# Patient Record
Sex: Male | Born: 1950
Health system: Southern US, Community
[De-identification: ages and names within clinical notes are randomized; demographics above are authoritative.]

---

## 2004-07-05 ENCOUNTER — Ambulatory Visit: Payer: Self-pay | Admitting: Physician Assistant

## 2004-11-07 ENCOUNTER — Encounter: Payer: Self-pay | Admitting: Unknown Physician Specialty

## 2005-08-23 ENCOUNTER — Emergency Department: Payer: Self-pay | Admitting: Unknown Physician Specialty

## 2005-08-23 ENCOUNTER — Other Ambulatory Visit: Payer: Self-pay

## 2006-11-08 ENCOUNTER — Observation Stay: Payer: Self-pay | Admitting: Internal Medicine

## 2006-11-08 ENCOUNTER — Other Ambulatory Visit: Payer: Self-pay

## 2006-12-27 ENCOUNTER — Ambulatory Visit: Payer: Self-pay | Admitting: Internal Medicine

## 2008-01-20 IMAGING — CT CT ABD-PELV W/ CM
1 of 2 series · 15 of 32 positions shown, 19 images · non-contrast
Comparison: none

REASON FOR EXAM: abd pain
COMMENTS:

[Series 2: abdomen · axial · 0.72mm/px · z∈[+192,+598]mm · 15 of 89 slices shown, 19 images]
[im 4/89  soft-tissue]
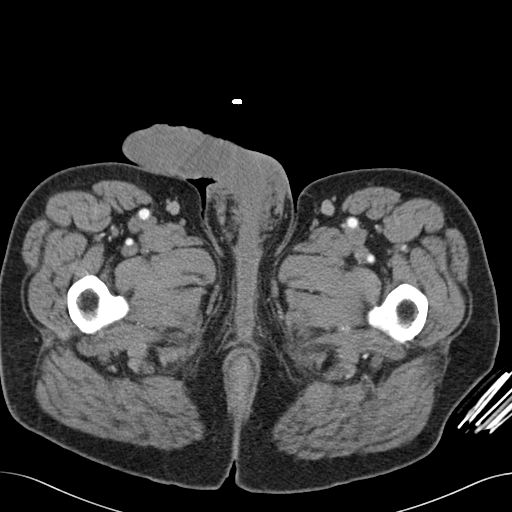
[im 4/89  bone]
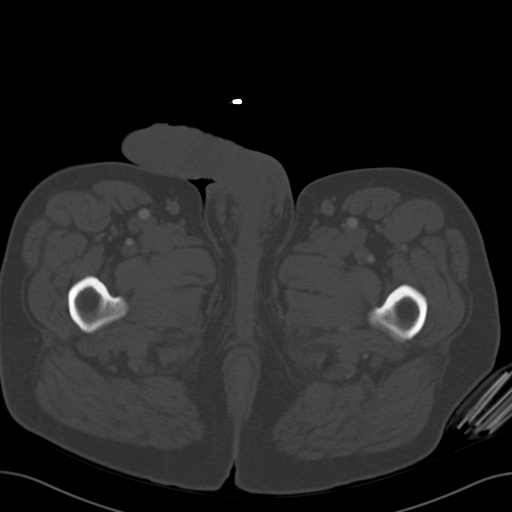
[im 12/89  soft-tissue]
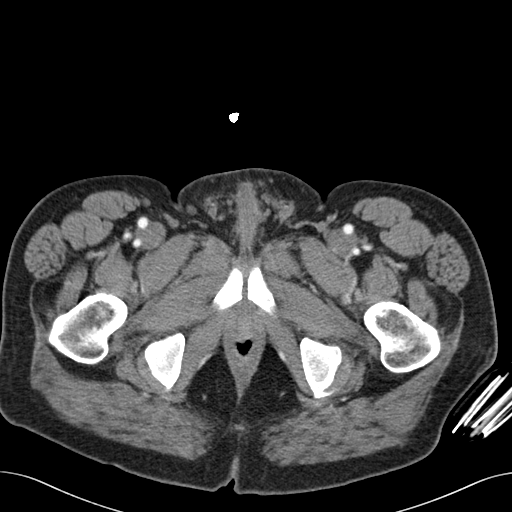
[im 19/89  soft-tissue]
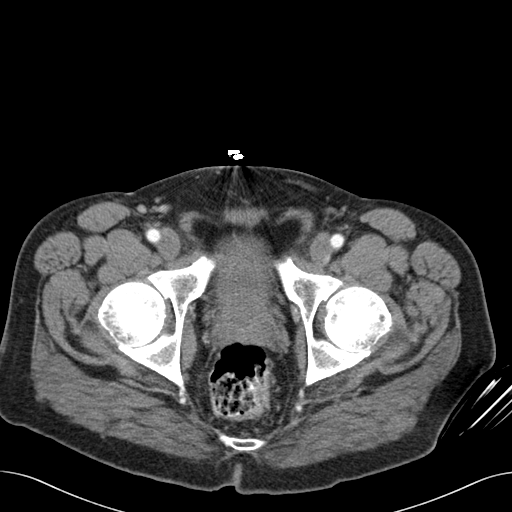
[im 26/89  soft-tissue]
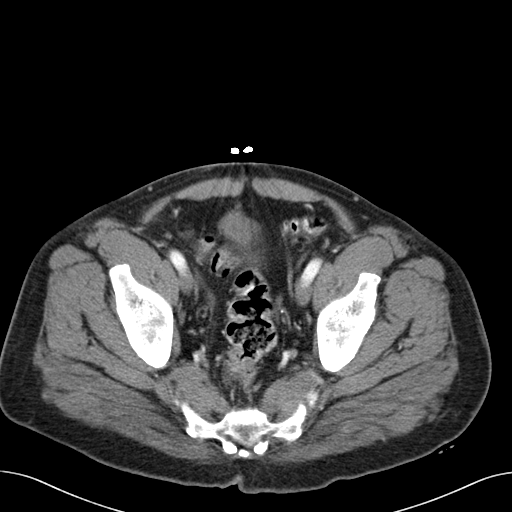
[im 30/89  soft-tissue]
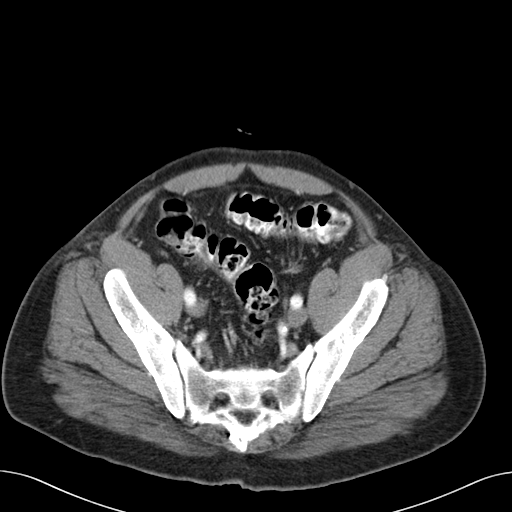
[im 37/89  soft-tissue]
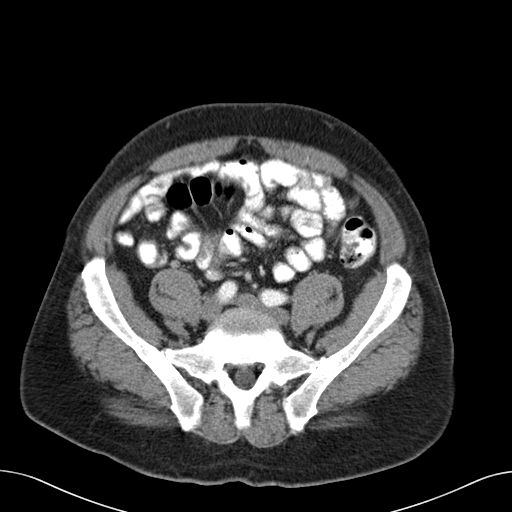
[im 45/89  soft-tissue]
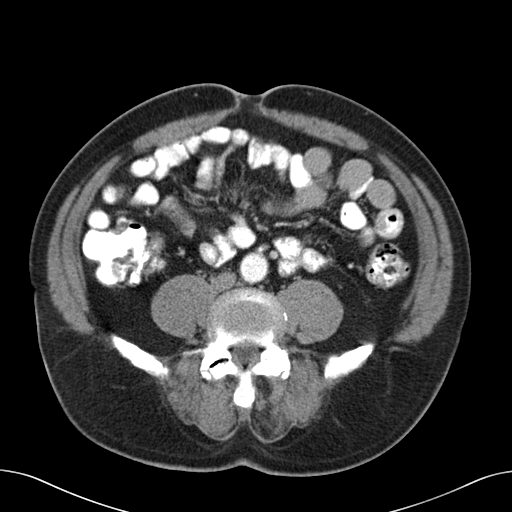
[im 52/89  soft-tissue]
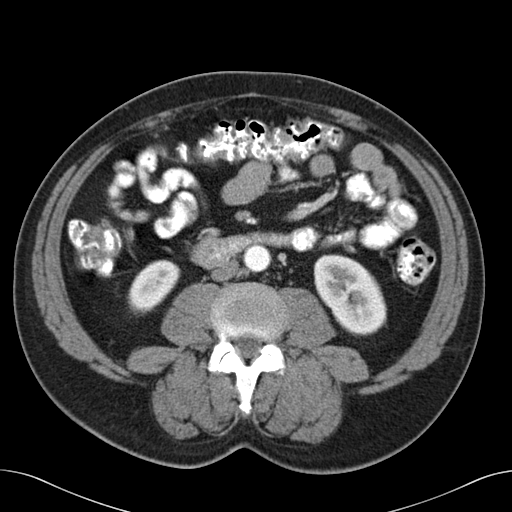
[im 59/89  soft-tissue]
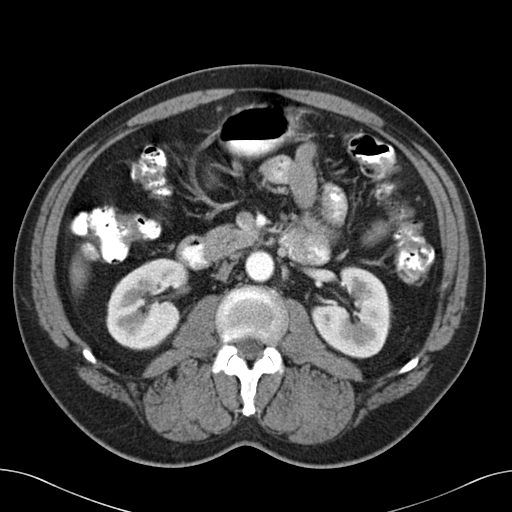
[im 59/89  bone]
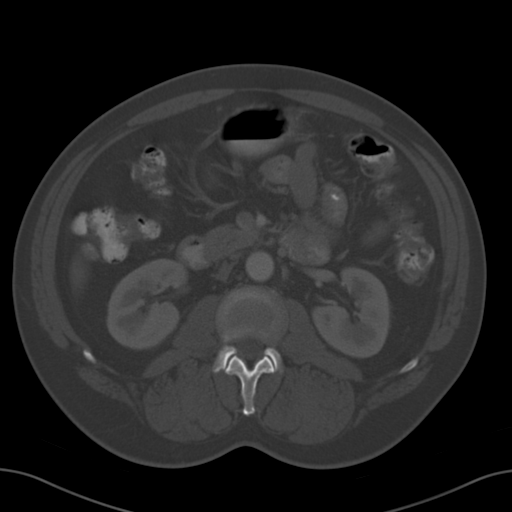
[im 63/89  soft-tissue]
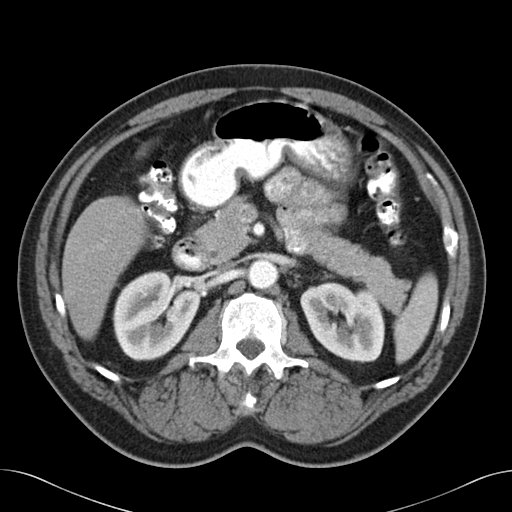
[im 70/89  soft-tissue]
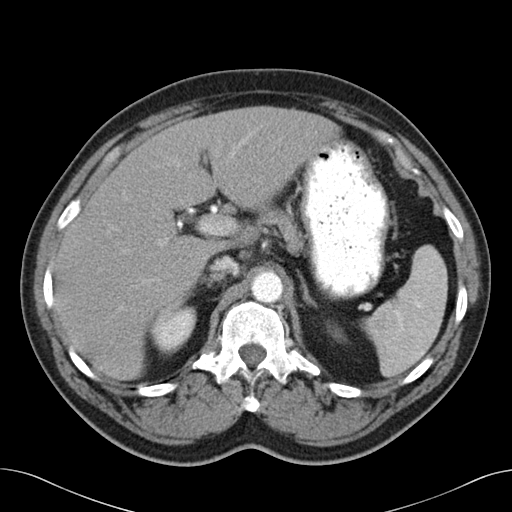
[im 74/89  lung]
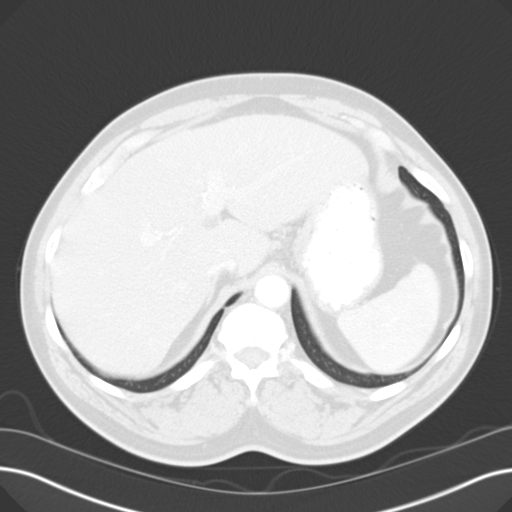
[im 78/89  soft-tissue]
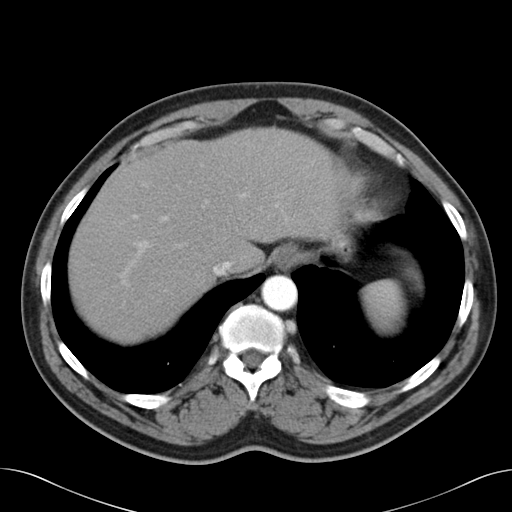
[im 78/89  lung]
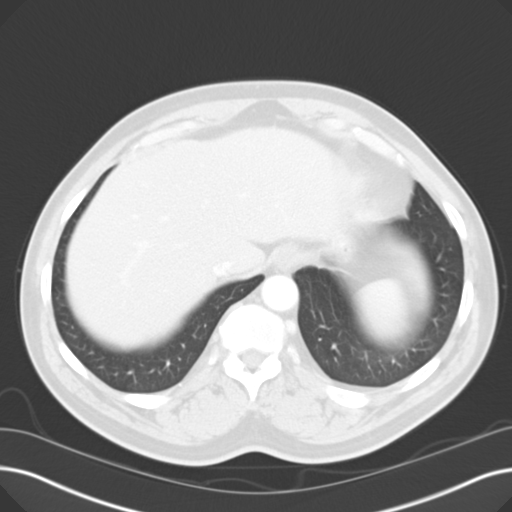
[im 81/89  lung]
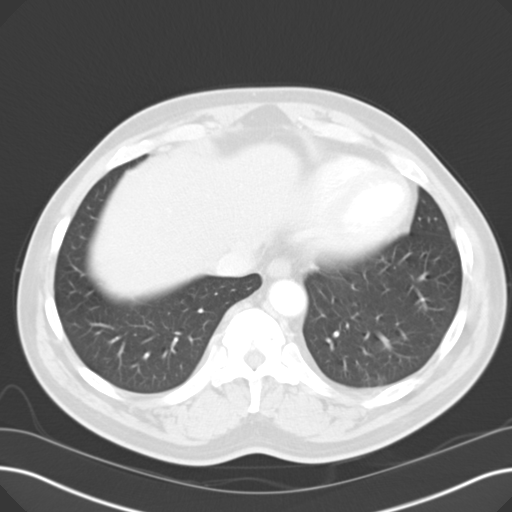
[im 85/89  soft-tissue]
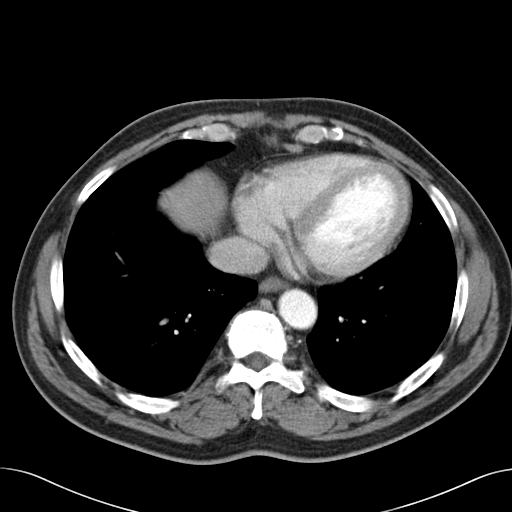
[im 85/89  lung]
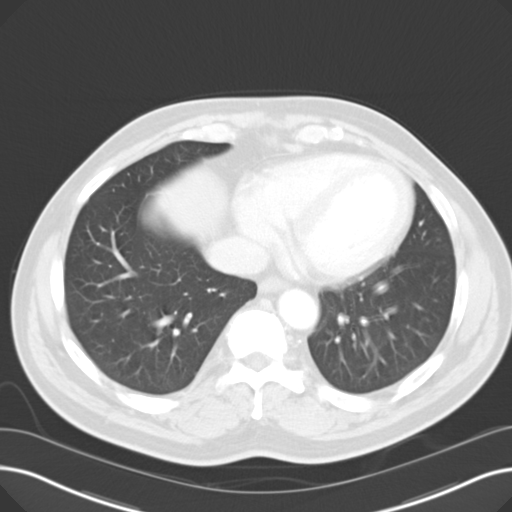

[15 of 32 positions shown; findings below may reference images not displayed]

PROCEDURE:     CT  - CT ABDOMEN / PELVIS  W  - December 27, 2006  [DATE]

RESULT:     IV contrast enhanced CT of the Abdomen and Pelvis was obtained.
The liver and spleen are normal.  The pancreas is normal. Adrenals are
normal.  No focal renal abnormality is identified.  The appendix is normal.
Surgical clips are noted in the gallbladder fossa. No bowel distention is
noted. No inguinal adenopathy is noted.  The lung bases are clear. No free
air is identified. A tiny umbilical hernia is present with just herniation
of fat. Small inguinal lymph nodes are noted. These are nonspecific.
IMPRESSION: 1.     Tiny umbilical hernia with just herniation of fat.
2.     The patient has had a prior cholecystectomy. There is no biliary
distention.  No other abnormality is identified.  The RIGHT lower quadrant
is unremarkable.  Retroperitoneum including abdominal aorta is unremarkable.
 The LEFT lower quadrant is unremarkable.

## 2009-03-12 ENCOUNTER — Other Ambulatory Visit: Payer: Self-pay | Admitting: Internal Medicine

## 2009-03-25 ENCOUNTER — Ambulatory Visit: Payer: Self-pay | Admitting: Internal Medicine

## 2009-04-06 ENCOUNTER — Ambulatory Visit: Payer: Self-pay | Admitting: Oncology

## 2009-04-15 LAB — CMP (CANCER CENTER ONLY)
ALT(SGPT): 65 U/L — ABNORMAL HIGH (ref 10–47)
AST: 44 U/L — ABNORMAL HIGH (ref 11–38)
Albumin: 3.7 g/dL (ref 3.3–5.5)
CO2: 29 mEq/L (ref 18–33)
Calcium: 9.4 mg/dL (ref 8.0–10.3)
Chloride: 102 mEq/L (ref 98–108)
Potassium: 4.4 mEq/L (ref 3.3–4.7)
Sodium: 137 mEq/L (ref 128–145)
Total Protein: 7.5 g/dL (ref 6.4–8.1)

## 2009-04-15 LAB — CBC WITH DIFFERENTIAL (CANCER CENTER ONLY)
BASO%: 0.5 % (ref 0.0–2.0)
EOS%: 5.9 % (ref 0.0–7.0)
HCT: 39.2 % (ref 38.7–49.9)
LYMPH#: 1.4 10*3/uL (ref 0.9–3.3)
MCHC: 33.4 g/dL (ref 32.0–35.9)
MONO#: 0.3 10*3/uL (ref 0.1–0.9)
NEUT#: 1.2 10*3/uL — ABNORMAL LOW (ref 1.5–6.5)
NEUT%: 39.2 % — ABNORMAL LOW (ref 40.0–80.0)
Platelets: 193 10*3/uL (ref 145–400)
RDW: 12.8 % (ref 10.5–14.6)
WBC: 3.1 10*3/uL — ABNORMAL LOW (ref 4.0–10.0)

## 2009-04-19 LAB — IRON AND TIBC: UIBC: 181 ug/dL

## 2009-04-19 LAB — PROTEIN ELECTROPHORESIS, SERUM
Albumin ELP: 53.7 % — ABNORMAL LOW (ref 55.8–66.1)
Alpha-1-Globulin: 4.2 % (ref 2.9–4.9)
Beta 2: 4.7 % (ref 3.2–6.5)
Beta Globulin: 5.9 % (ref 4.7–7.2)
Total Protein, Serum Electrophoresis: 7.3 g/dL (ref 6.0–8.3)

## 2009-04-19 LAB — LACTATE DEHYDROGENASE: LDH: 139 U/L (ref 94–250)

## 2009-06-11 ENCOUNTER — Ambulatory Visit: Payer: Self-pay | Admitting: Oncology

## 2009-06-17 LAB — CBC WITH DIFFERENTIAL/PLATELET
BASO%: 0.9 % (ref 0.0–2.0)
EOS%: 4.9 % (ref 0.0–7.0)
LYMPH%: 42.1 % (ref 14.0–49.0)
MCH: 29.3 pg (ref 27.2–33.4)
MCHC: 33 g/dL (ref 32.0–36.0)
MONO#: 0.6 10*3/uL (ref 0.1–0.9)
Platelets: 174 10*3/uL (ref 140–400)
RBC: 4.4 10*6/uL (ref 4.20–5.82)
WBC: 3.3 10*3/uL — ABNORMAL LOW (ref 4.0–10.3)

## 2010-04-18 IMAGING — US ABDOMEN ULTRASOUND
1 series · 17 of 25 positions shown · non-contrast
Comparison: none

REASON FOR EXAM: Abn LFT
COMMENTS:

[Series 1: abdomen ultrasound · 17 of 52 slices shown]
[im 1/52]
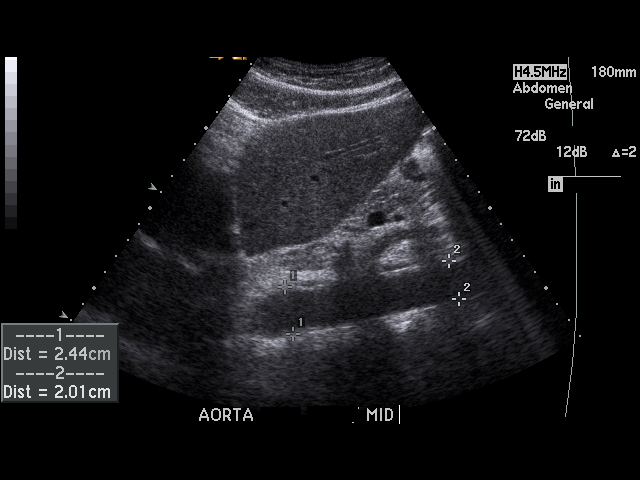
[im 5/52]
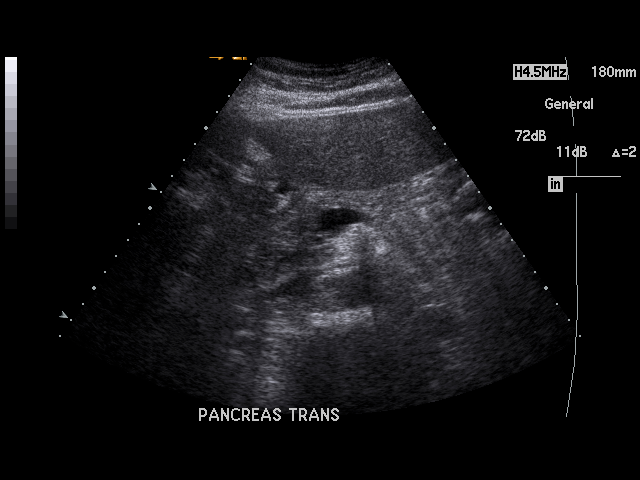
[im 7/52]
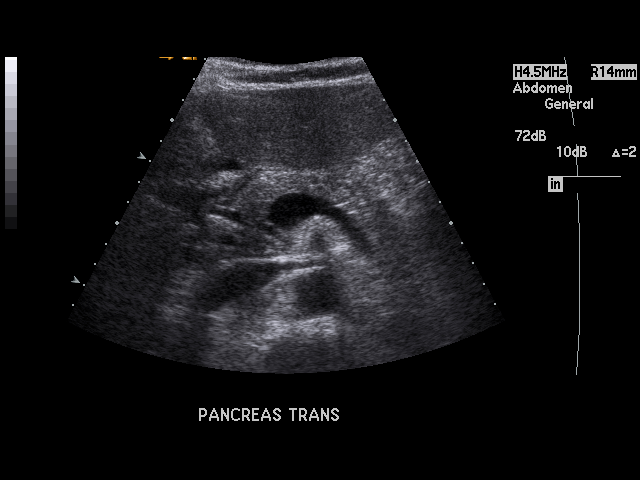
[im 11/52]
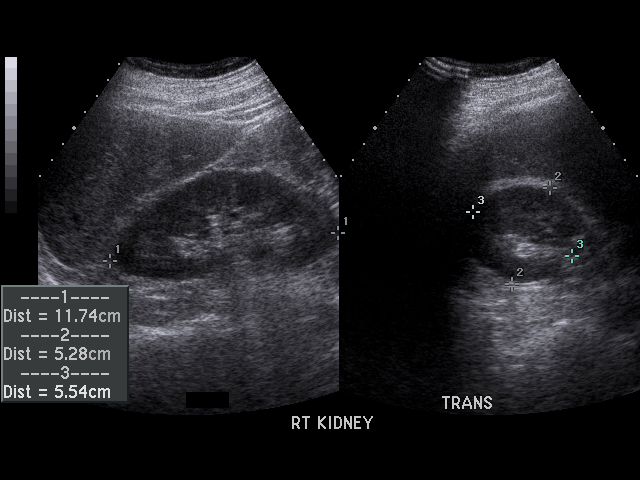
[im 13/52]
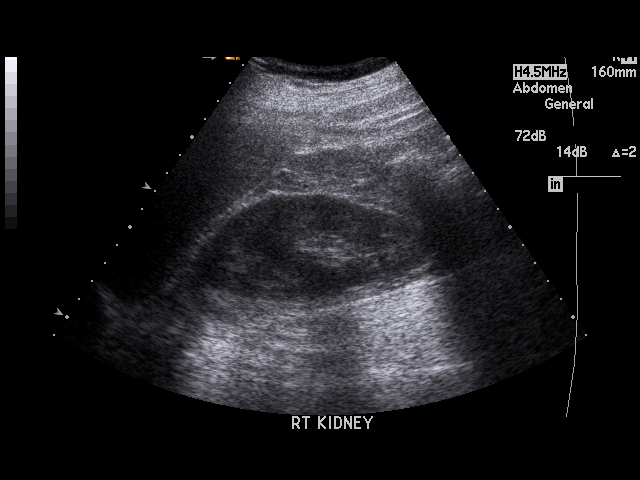
[im 18/52]
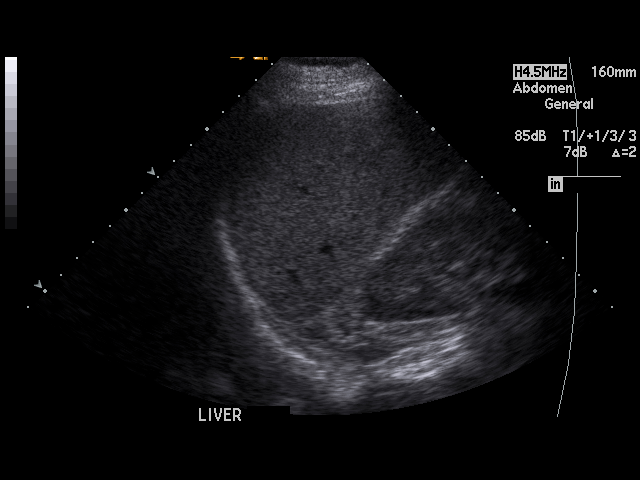
[im 20/52]
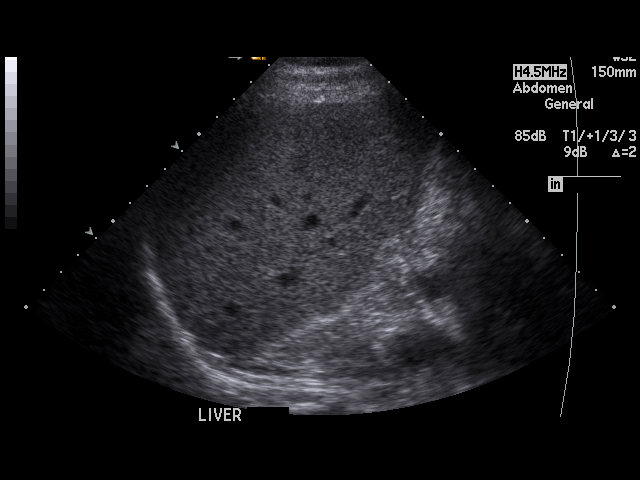
[im 24/52]
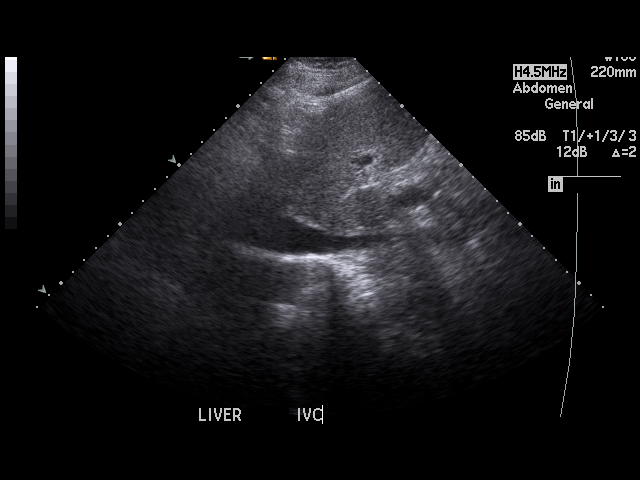
[im 26/52]
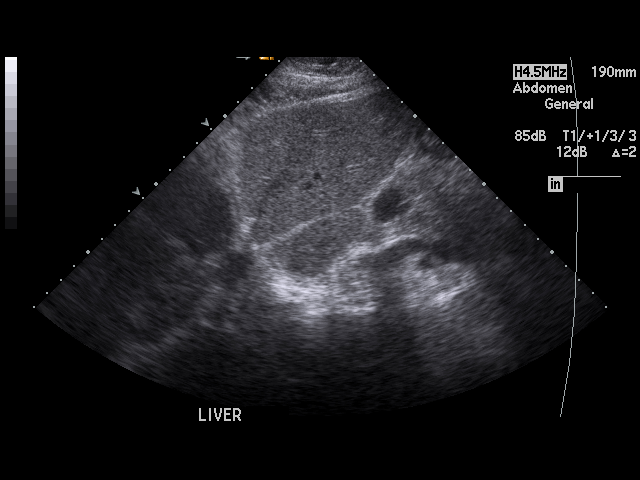
[im 28/52]
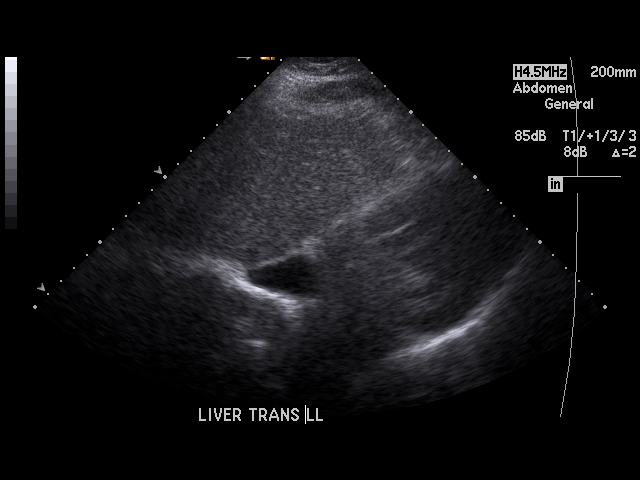
[im 32/52]
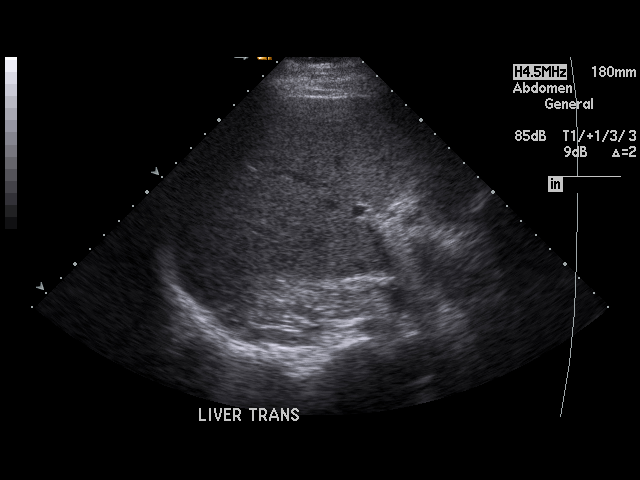
[im 35/52]
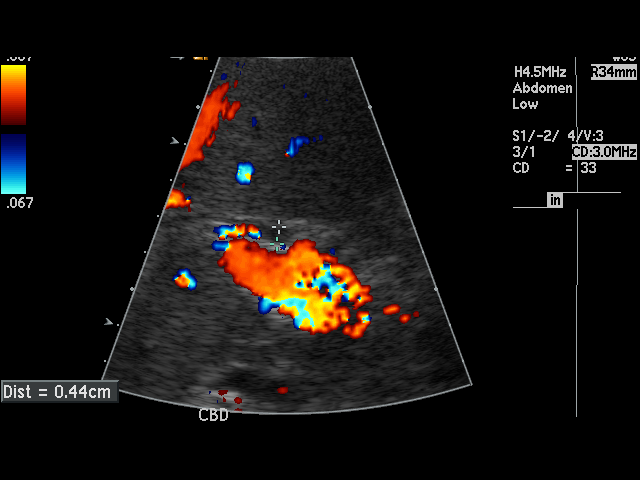
[im 39/52]
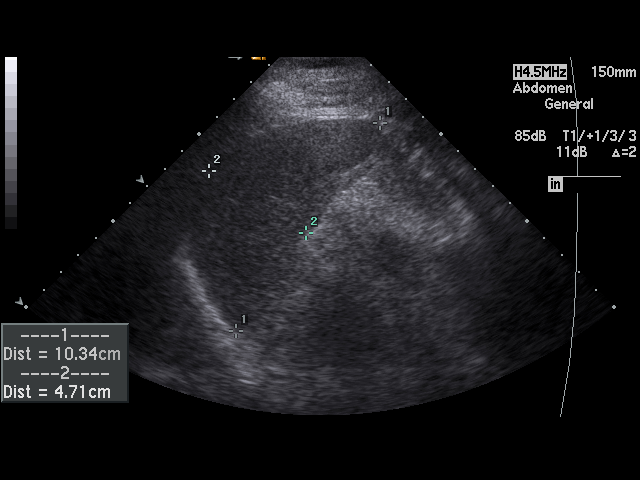
[im 41/52]
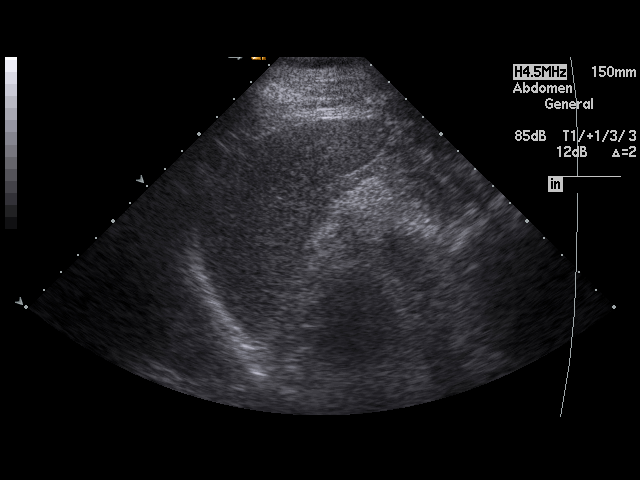
[im 45/52]
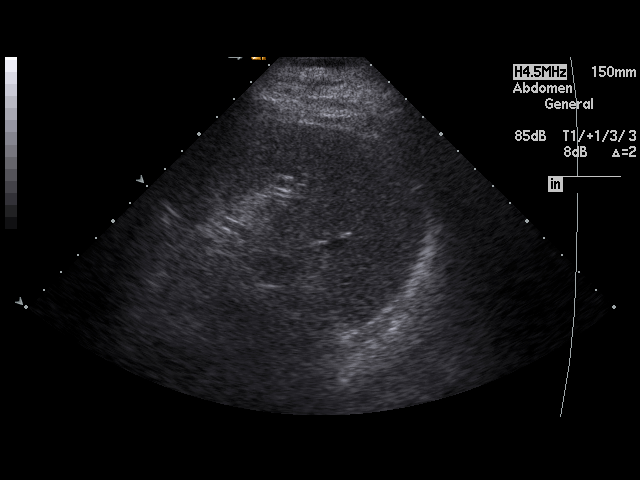
[im 47/52]
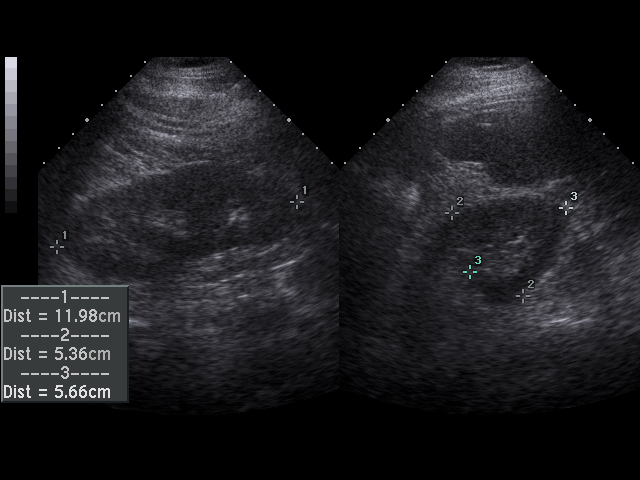
[im 52/52]
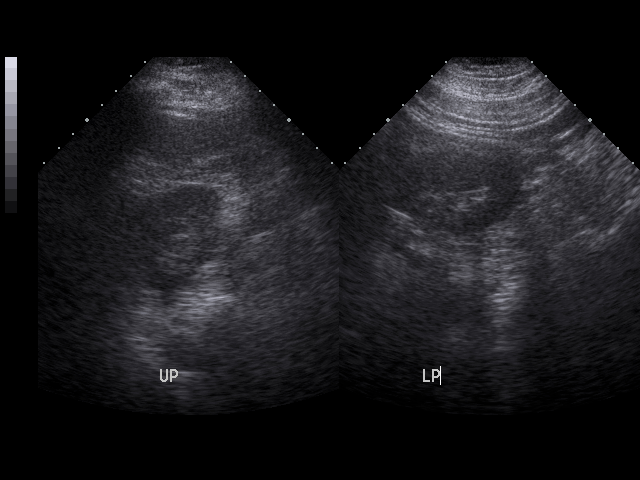

[17 of 25 positions shown; findings below may reference images not displayed]

PROCEDURE:     US  - US ABDOMEN GENERAL SURVEY  - March 25, 2009  [DATE]

RESULT:     Ultrasound of the abdomen demonstrates a normal taper of the
abdominal aorta without aneurysm. The pancreas is seen and appears
unremarkable. The right kidney appears unremarkable in size and echotexture.
The hepatic echotexture appears normal without evidence of ductal dilation
or mass. The common bile duct diameter is 4.4 mm. The portal venous flow is
normal. The patient is status post cholecystectomy. The spleen and left
kidney appear normal in size and echotexture.
IMPRESSION: Normal appearing postcholecystectomy abdominal sonogram.

## 2015-05-27 DIAGNOSIS — H524 Presbyopia: Secondary | ICD-10-CM | POA: Diagnosis not present

## 2015-05-27 DIAGNOSIS — H401122 Primary open-angle glaucoma, left eye, moderate stage: Secondary | ICD-10-CM | POA: Diagnosis not present

## 2015-05-27 DIAGNOSIS — H401113 Primary open-angle glaucoma, right eye, severe stage: Secondary | ICD-10-CM | POA: Diagnosis not present

## 2015-06-30 DIAGNOSIS — H402213 Chronic angle-closure glaucoma, right eye, severe stage: Secondary | ICD-10-CM | POA: Diagnosis not present

## 2015-08-18 DIAGNOSIS — H402213 Chronic angle-closure glaucoma, right eye, severe stage: Secondary | ICD-10-CM | POA: Diagnosis not present

## 2015-08-24 DIAGNOSIS — K740 Hepatic fibrosis: Secondary | ICD-10-CM | POA: Diagnosis not present

## 2015-08-24 DIAGNOSIS — Z8619 Personal history of other infectious and parasitic diseases: Secondary | ICD-10-CM | POA: Diagnosis not present

## 2015-08-24 DIAGNOSIS — Z1289 Encounter for screening for malignant neoplasm of other sites: Secondary | ICD-10-CM | POA: Diagnosis not present

## 2015-08-24 DIAGNOSIS — B1920 Unspecified viral hepatitis C without hepatic coma: Secondary | ICD-10-CM | POA: Diagnosis not present

## 2015-09-06 DIAGNOSIS — H402213 Chronic angle-closure glaucoma, right eye, severe stage: Secondary | ICD-10-CM | POA: Diagnosis not present

## 2015-09-08 DIAGNOSIS — H402213 Chronic angle-closure glaucoma, right eye, severe stage: Secondary | ICD-10-CM | POA: Diagnosis not present

## 2015-10-06 DIAGNOSIS — H402213 Chronic angle-closure glaucoma, right eye, severe stage: Secondary | ICD-10-CM | POA: Diagnosis not present

## 2015-12-08 DIAGNOSIS — H402213 Chronic angle-closure glaucoma, right eye, severe stage: Secondary | ICD-10-CM | POA: Diagnosis not present

## 2016-03-15 DIAGNOSIS — H402213 Chronic angle-closure glaucoma, right eye, severe stage: Secondary | ICD-10-CM | POA: Diagnosis not present

## 2016-05-17 ENCOUNTER — Other Ambulatory Visit: Payer: Self-pay | Admitting: Otolaryngology

## 2016-05-17 DIAGNOSIS — R070 Pain in throat: Secondary | ICD-10-CM | POA: Diagnosis not present

## 2016-05-17 DIAGNOSIS — M792 Neuralgia and neuritis, unspecified: Secondary | ICD-10-CM | POA: Diagnosis not present

## 2016-05-18 ENCOUNTER — Other Ambulatory Visit: Payer: Self-pay | Admitting: Otolaryngology

## 2016-05-18 DIAGNOSIS — R1310 Dysphagia, unspecified: Secondary | ICD-10-CM

## 2016-05-23 ENCOUNTER — Ambulatory Visit: Payer: 59

## 2016-07-24 DIAGNOSIS — H402213 Chronic angle-closure glaucoma, right eye, severe stage: Secondary | ICD-10-CM | POA: Diagnosis not present

## 2016-08-17 DIAGNOSIS — H402213 Chronic angle-closure glaucoma, right eye, severe stage: Secondary | ICD-10-CM | POA: Diagnosis not present

## 2016-12-25 DIAGNOSIS — N5201 Erectile dysfunction due to arterial insufficiency: Secondary | ICD-10-CM | POA: Diagnosis not present

## 2016-12-25 DIAGNOSIS — Z79899 Other long term (current) drug therapy: Secondary | ICD-10-CM | POA: Diagnosis not present

## 2016-12-25 DIAGNOSIS — N401 Enlarged prostate with lower urinary tract symptoms: Secondary | ICD-10-CM | POA: Diagnosis not present

## 2016-12-25 DIAGNOSIS — E291 Testicular hypofunction: Secondary | ICD-10-CM | POA: Diagnosis not present

## 2016-12-25 DIAGNOSIS — R351 Nocturia: Secondary | ICD-10-CM | POA: Diagnosis not present

## 2017-03-13 DIAGNOSIS — H402213 Chronic angle-closure glaucoma, right eye, severe stage: Secondary | ICD-10-CM | POA: Diagnosis not present

## 2017-04-09 DIAGNOSIS — H402213 Chronic angle-closure glaucoma, right eye, severe stage: Secondary | ICD-10-CM | POA: Diagnosis not present

## 2017-04-25 DIAGNOSIS — H402213 Chronic angle-closure glaucoma, right eye, severe stage: Secondary | ICD-10-CM | POA: Diagnosis not present

## 2017-05-04 DIAGNOSIS — M5489 Other dorsalgia: Secondary | ICD-10-CM | POA: Diagnosis not present

## 2017-05-04 DIAGNOSIS — N23 Unspecified renal colic: Secondary | ICD-10-CM | POA: Diagnosis not present

## 2017-05-07 ENCOUNTER — Other Ambulatory Visit: Payer: Self-pay | Admitting: Urology

## 2017-05-07 DIAGNOSIS — R109 Unspecified abdominal pain: Secondary | ICD-10-CM

## 2017-05-14 ENCOUNTER — Ambulatory Visit
Admission: RE | Admit: 2017-05-14 | Discharge: 2017-05-14 | Disposition: A | Discharge status: Home / Self Care | Payer: 59 | Source: Ambulatory Visit | Attending: Urology | Admitting: Urology

## 2017-05-14 DIAGNOSIS — R109 Unspecified abdominal pain: Secondary | ICD-10-CM | POA: Diagnosis not present

## 2017-05-14 LAB — POCT I-STAT CREATININE: CREATININE: 0.8 mg/dL (ref 0.61–1.24)

## 2017-05-14 MED ORDER — IOPAMIDOL (ISOVUE-300) INJECTION 61%
100.0000 mL | Freq: Once | INTRAVENOUS | Status: AC | PRN
  Administered 2017-05-14: 100 mL via INTRAVENOUS

## 2017-06-26 DIAGNOSIS — H402213 Chronic angle-closure glaucoma, right eye, severe stage: Secondary | ICD-10-CM | POA: Diagnosis not present

## 2017-12-27 DIAGNOSIS — H402213 Chronic angle-closure glaucoma, right eye, severe stage: Secondary | ICD-10-CM | POA: Diagnosis not present

## 2018-02-12 DIAGNOSIS — H402213 Chronic angle-closure glaucoma, right eye, severe stage: Secondary | ICD-10-CM | POA: Diagnosis not present

## 2018-02-21 DIAGNOSIS — H402213 Chronic angle-closure glaucoma, right eye, severe stage: Secondary | ICD-10-CM | POA: Diagnosis not present

## 2018-04-16 MED FILL — DORZOLAMIDE HCL 2 % SOLN: 2 | 30 days supply | Qty: 10 | Fill #0

## 2018-06-07 IMAGING — CT CT ABD-PEL WO/W CM
2 of 9 series · 12 of 46 positions shown, 18 images · IV contrast (iopamidol)
Comparison: CT abdomen pelvis 12/27/2006.

CLINICAL DATA: Patient with left flank pain for 3 weeks.

EXAM:
CT ABDOMEN AND PELVIS WITHOUT AND WITH CONTRAST
TECHNIQUE: Multidetector CT imaging of the abdomen and pelvis was performed
following the standard protocol before and following the bolus
administration of intravenous contrast.
CONTRAST:  100mL [id number] IOPAMIDOL ([id number]) INJECTION 61%

[Series 2: abd pelvis pre · axial · non-contrast · 0.74mm/px · z∈[-1608,-1248]mm · 9 of 88 slices shown, 15 images (1 of 2)]
[im 8/88  soft-tissue]
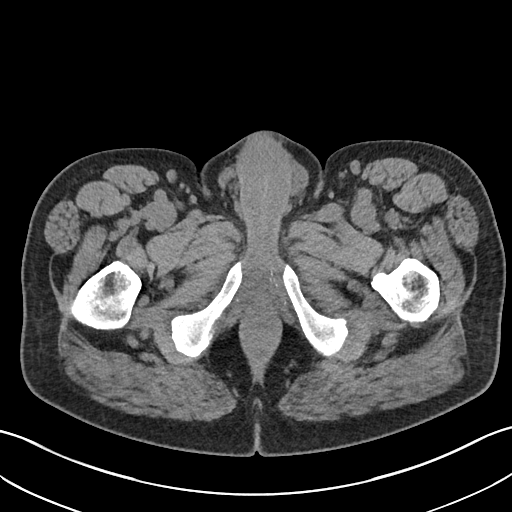
[im 8/88  bone]
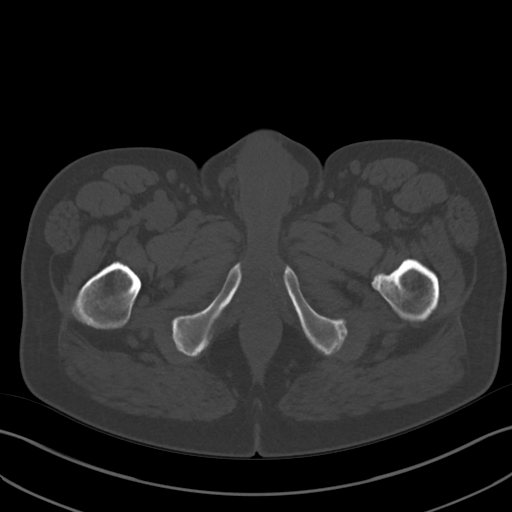
[im 16/88  soft-tissue]
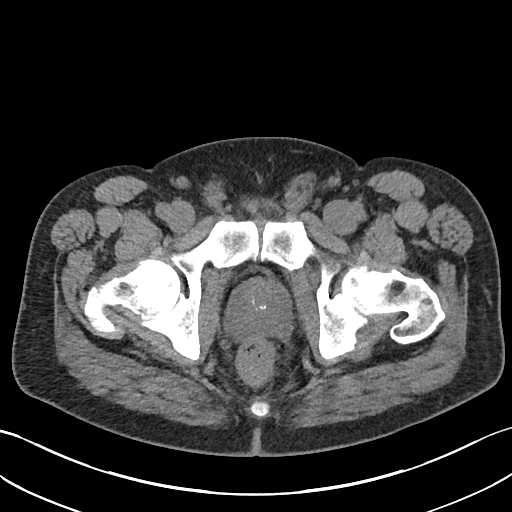
[im 24/88  soft-tissue]
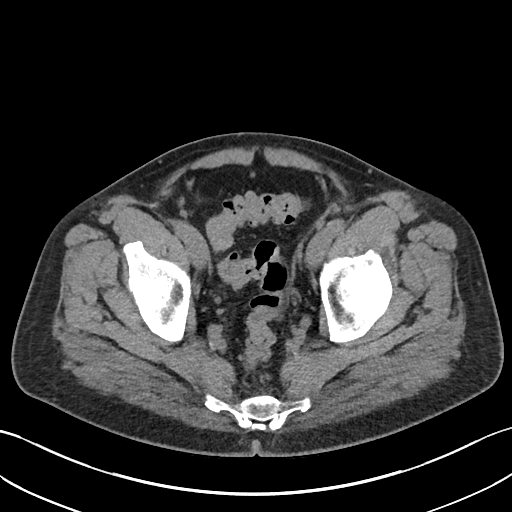
[im 32/88  soft-tissue]
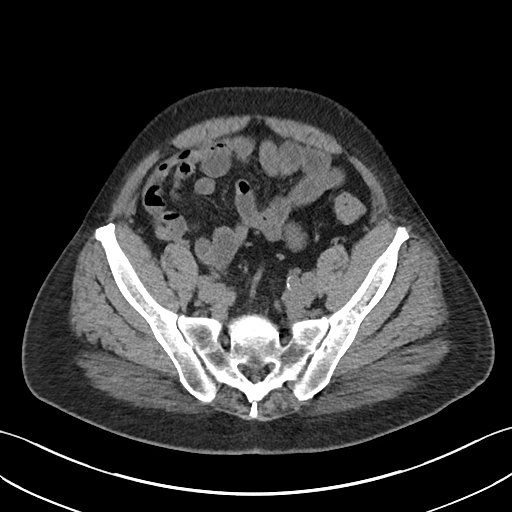
[im 48/88  soft-tissue]
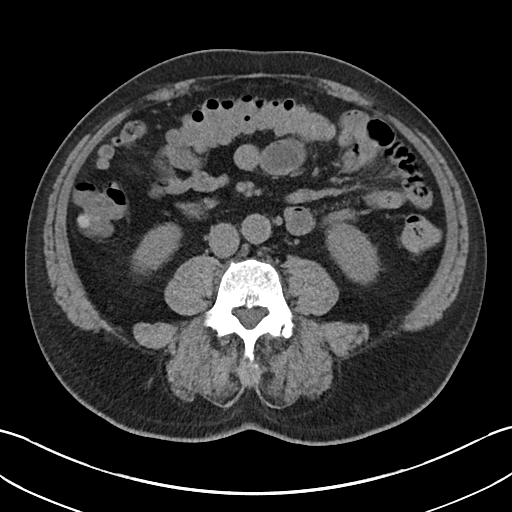
[im 56/88  soft-tissue]
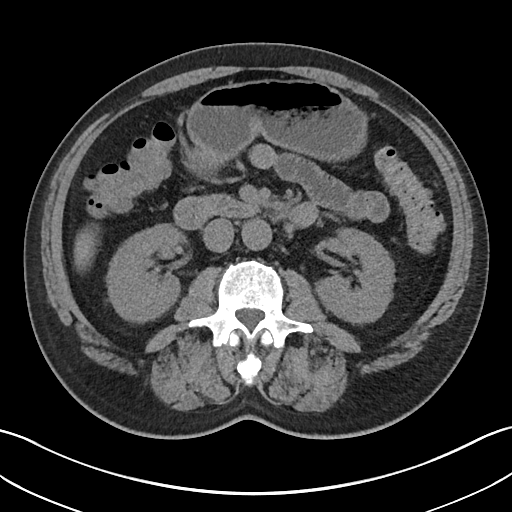
[im 56/88  lung]
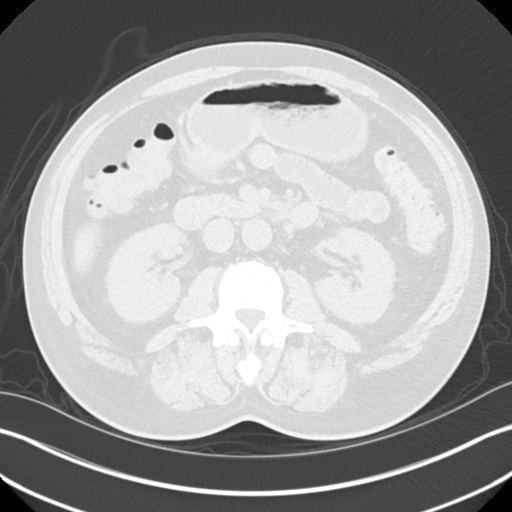
[im 64/88  soft-tissue]
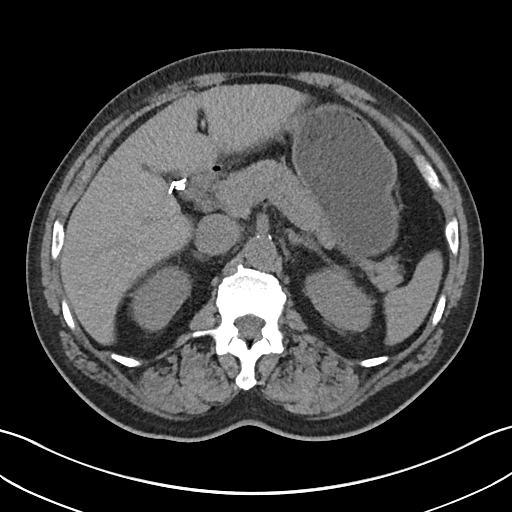
[im 64/88  lung]
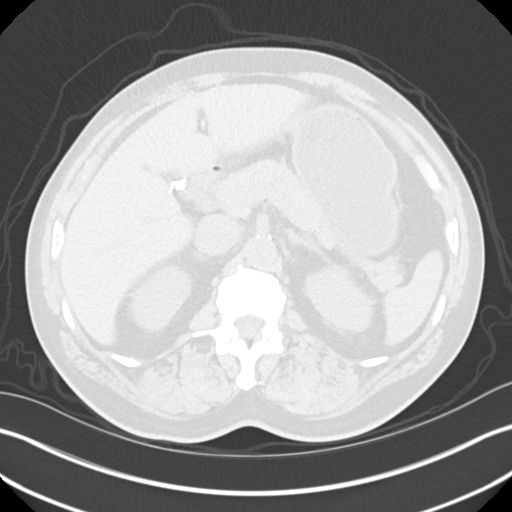
[im 72/88  soft-tissue]
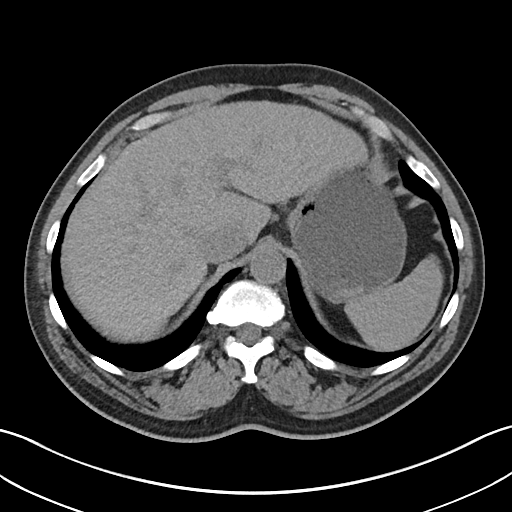
[im 72/88  lung]
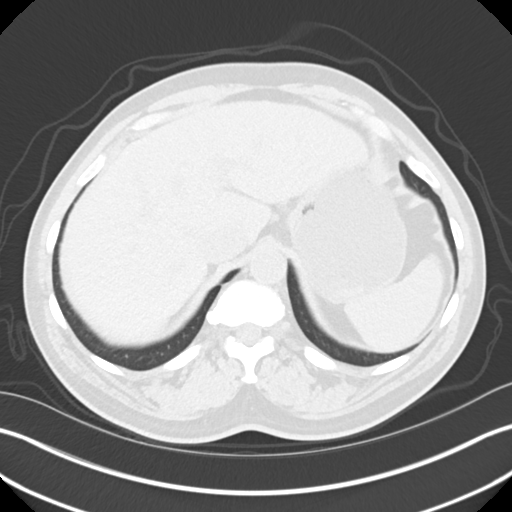
[im 80/88  soft-tissue]
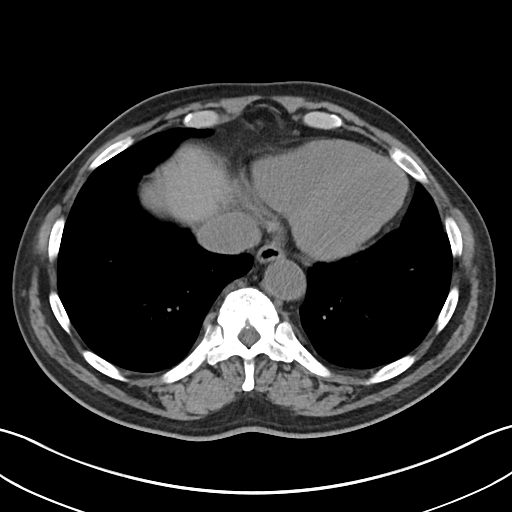
[im 80/88  lung]
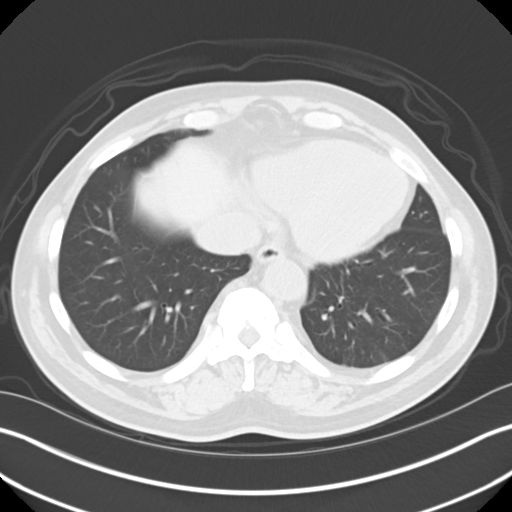
[im 80/88  bone]
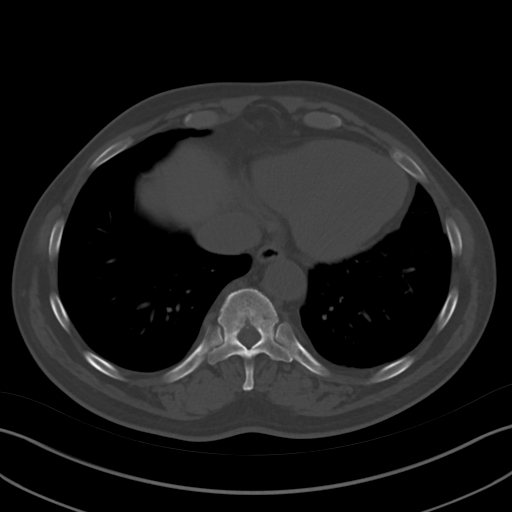

[Series 4: abd pelvis pre · coronal · non-contrast · 0.74mm/px · 3 of 154 slices shown (2 of 2)]
[im 39/154  soft-tissue]
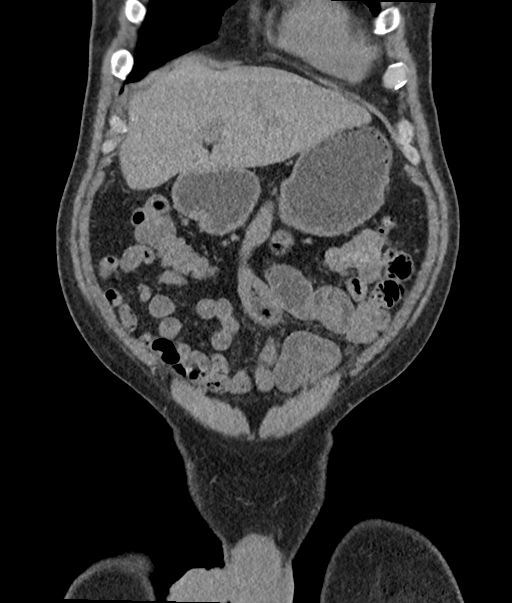
[im 77/154  soft-tissue]
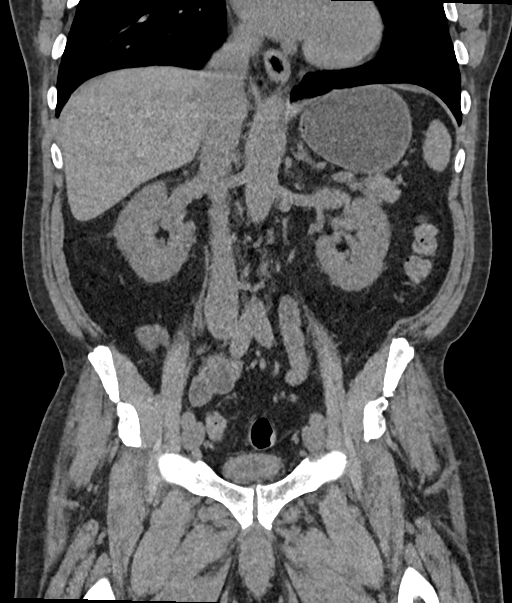
[im 115/154  soft-tissue]
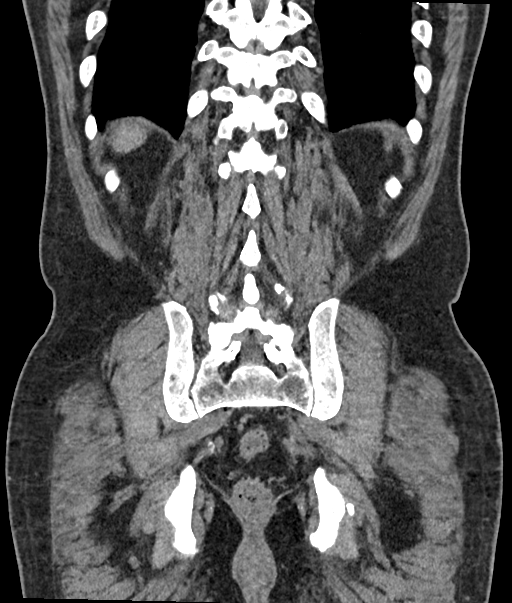

[12 of 46 positions shown; findings below may reference images not displayed]

FINDINGS: Lower chest: Normal heart size. Lung bases are clear. No pleural
effusion.

Hepatobiliary: Liver is normal in size and contour. No focal hepatic
lesion is identified. Patient status post cholecystectomy. No
intrahepatic or extrahepatic biliary ductal dilatation.

Pancreas: Unremarkable

Spleen: Unremarkable

Adrenals/Urinary Tract: Adrenal glands are normal. Noncontrast
images demonstrate no nephroureterolithiasis. No hydronephrosis.
Kidneys enhance symmetrically with contrast. No suspicious enhancing
renal masses are identified. Urinary bladder is decompressed.

Stomach/Bowel: No abnormal bowel wall thickening or evidence for
bowel obstruction. No free fluid or free intraperitoneal air. Normal
morphology of the stomach.

Vascular/Lymphatic: Normal caliber abdominal aorta. Peripheral
calcified atherosclerotic plaque. No retroperitoneal
lymphadenopathy.

Reproductive: Central dystrophic calcifications in the prostate.

Other: None.

Musculoskeletal: Lumbar spine degenerative changes. No aggressive or
acute appearing osseous lesions.
IMPRESSION: 1. No nephroureterolithiasis.  No hydronephrosis.
2. No acute process within the abdomen or pelvis.

## 2018-08-23 DIAGNOSIS — R152 Fecal urgency: Secondary | ICD-10-CM | POA: Diagnosis not present

## 2018-08-23 DIAGNOSIS — R252 Cramp and spasm: Secondary | ICD-10-CM | POA: Diagnosis not present

## 2018-08-23 DIAGNOSIS — I10 Essential (primary) hypertension: Secondary | ICD-10-CM | POA: Diagnosis not present

## 2018-08-23 DIAGNOSIS — B182 Chronic viral hepatitis C: Secondary | ICD-10-CM | POA: Diagnosis not present

## 2018-08-27 DIAGNOSIS — I10 Essential (primary) hypertension: Secondary | ICD-10-CM | POA: Diagnosis not present

## 2018-08-27 DIAGNOSIS — R252 Cramp and spasm: Secondary | ICD-10-CM | POA: Diagnosis not present

## 2018-08-27 DIAGNOSIS — R152 Fecal urgency: Secondary | ICD-10-CM | POA: Diagnosis not present

## 2018-08-27 DIAGNOSIS — B182 Chronic viral hepatitis C: Secondary | ICD-10-CM | POA: Diagnosis not present

## 2018-09-16 DIAGNOSIS — B1920 Unspecified viral hepatitis C without hepatic coma: Secondary | ICD-10-CM | POA: Diagnosis not present

## 2018-10-01 DIAGNOSIS — H401133 Primary open-angle glaucoma, bilateral, severe stage: Secondary | ICD-10-CM | POA: Diagnosis not present

## 2018-10-01 DIAGNOSIS — H2513 Age-related nuclear cataract, bilateral: Secondary | ICD-10-CM | POA: Diagnosis not present

## 2018-11-02 DIAGNOSIS — Z20828 Contact with and (suspected) exposure to other viral communicable diseases: Secondary | ICD-10-CM | POA: Diagnosis not present

## 2018-11-02 DIAGNOSIS — H401133 Primary open-angle glaucoma, bilateral, severe stage: Secondary | ICD-10-CM | POA: Diagnosis not present

## 2018-11-09 DIAGNOSIS — H401133 Primary open-angle glaucoma, bilateral, severe stage: Secondary | ICD-10-CM | POA: Diagnosis not present

## 2018-11-09 DIAGNOSIS — Z20828 Contact with and (suspected) exposure to other viral communicable diseases: Secondary | ICD-10-CM | POA: Diagnosis not present

## 2018-11-11 DIAGNOSIS — H401123 Primary open-angle glaucoma, left eye, severe stage: Secondary | ICD-10-CM | POA: Diagnosis not present

## 2018-11-11 DIAGNOSIS — H2512 Age-related nuclear cataract, left eye: Secondary | ICD-10-CM | POA: Diagnosis not present

## 2018-12-10 DIAGNOSIS — Z961 Presence of intraocular lens: Secondary | ICD-10-CM | POA: Diagnosis not present

## 2019-01-25 DIAGNOSIS — H401133 Primary open-angle glaucoma, bilateral, severe stage: Secondary | ICD-10-CM | POA: Diagnosis not present

## 2019-01-25 DIAGNOSIS — Z20828 Contact with and (suspected) exposure to other viral communicable diseases: Secondary | ICD-10-CM | POA: Diagnosis not present

## 2019-01-27 DIAGNOSIS — H401113 Primary open-angle glaucoma, right eye, severe stage: Secondary | ICD-10-CM | POA: Diagnosis not present

## 2019-01-27 DIAGNOSIS — K219 Gastro-esophageal reflux disease without esophagitis: Secondary | ICD-10-CM | POA: Diagnosis not present

## 2019-01-27 DIAGNOSIS — Z79899 Other long term (current) drug therapy: Secondary | ICD-10-CM | POA: Diagnosis not present

## 2019-01-27 DIAGNOSIS — H2511 Age-related nuclear cataract, right eye: Secondary | ICD-10-CM | POA: Diagnosis not present

## 2019-03-08 ENCOUNTER — Ambulatory Visit: Payer: Self-pay | Attending: Internal Medicine

## 2019-03-08 DIAGNOSIS — Z23 Encounter for immunization: Secondary | ICD-10-CM | POA: Insufficient documentation

## 2019-03-08 NOTE — Progress Notes (Signed)
   Covid-19 Vaccination Clinic  Name:  KHRISTIAN PHILLIPPI    MRN: [id number] DOB: [birth year]  03/08/2019  Mr. Hathorne was observed post Covid-19 immunization for 15 minutes without incidence. He was provided with Vaccine Information Sheet and instruction to access the V-Safe system.   Mr. Haydel was instructed to call 911 with any severe reactions post vaccine: Marland Kitchen Difficulty breathing  . Swelling of your face and throat  . A fast heartbeat  . A bad rash all over your body  . Dizziness and weakness    Immunizations Administered    Name Date Dose VIS Date Route   Pfizer COVID-19 Vaccine 03/08/2019 10:05 AM 0.3 mL 01/03/2019 Intramuscular   Manufacturer: [employer], [employer]   Lot: [id number]   NDC: [address]-5

## 2019-03-29 ENCOUNTER — Ambulatory Visit: Payer: Medicare HMO | Attending: Internal Medicine

## 2019-03-29 ENCOUNTER — Other Ambulatory Visit: Payer: Self-pay

## 2019-03-29 DIAGNOSIS — Z23 Encounter for immunization: Secondary | ICD-10-CM | POA: Insufficient documentation

## 2019-03-29 NOTE — Progress Notes (Signed)
   Covid-19 Vaccination Clinic  Name:  [name]    MRN: [id number] DOB: 1950/09/08  03/29/2019  Mr. Kappes was observed post Covid-19 immunization for 15 minutes without incident. He was provided with Vaccine Information Sheet and instruction to access the V-Safe system.   Mr. Claytor was instructed to call 911 with any severe reactions post vaccine: Marland Kitchen Difficulty breathing  . Swelling of face and throat  . A fast heartbeat  . A bad rash all over body  . Dizziness and weakness   Immunizations Administered    Name Date Dose VIS Date Route   Pfizer COVID-19 Vaccine 03/29/2019 10:51 AM 0.3 mL 01/03/2019 Intramuscular   Manufacturer: [employer], [employer]   Lot: [id number]   NDC: [address]-3

## 2022-09-12 DIAGNOSIS — H401133 Primary open-angle glaucoma, bilateral, severe stage: Secondary | ICD-10-CM | POA: Diagnosis not present
